# Patient Record
Sex: Male | Born: 1960 | Race: White | Hispanic: No
Health system: Southern US, Community
[De-identification: ages and names within clinical notes are randomized; demographics above are authoritative.]

---

## 2018-05-11 ENCOUNTER — Emergency Department (HOSPITAL_BASED_OUTPATIENT_CLINIC_OR_DEPARTMENT_OTHER): Payer: Self-pay

## 2018-05-11 ENCOUNTER — Emergency Department (HOSPITAL_BASED_OUTPATIENT_CLINIC_OR_DEPARTMENT_OTHER)
Admission: EM | Admit: 2018-05-11 | Discharge: 2018-05-11 | Disposition: A | Discharge status: Home / Self Care | Payer: Self-pay | Attending: Emergency Medicine | Admitting: Emergency Medicine

## 2018-05-11 ENCOUNTER — Encounter (HOSPITAL_BASED_OUTPATIENT_CLINIC_OR_DEPARTMENT_OTHER): Payer: Self-pay | Admitting: Emergency Medicine

## 2018-05-11 ENCOUNTER — Other Ambulatory Visit: Payer: Self-pay

## 2018-05-11 DIAGNOSIS — Z23 Encounter for immunization: Secondary | ICD-10-CM | POA: Insufficient documentation

## 2018-05-11 DIAGNOSIS — Y9389 Activity, other specified: Secondary | ICD-10-CM | POA: Insufficient documentation

## 2018-05-11 DIAGNOSIS — W25XXXA Contact with sharp glass, initial encounter: Secondary | ICD-10-CM | POA: Insufficient documentation

## 2018-05-11 DIAGNOSIS — Y929 Unspecified place or not applicable: Secondary | ICD-10-CM | POA: Insufficient documentation

## 2018-05-11 DIAGNOSIS — S61411A Laceration without foreign body of right hand, initial encounter: Secondary | ICD-10-CM | POA: Insufficient documentation

## 2018-05-11 DIAGNOSIS — Y999 Unspecified external cause status: Secondary | ICD-10-CM | POA: Insufficient documentation

## 2018-05-11 MED ORDER — CEPHALEXIN 500 MG PO CAPS: 500.0000 mg | ORAL_CAPSULE | Freq: Four times a day (QID) | ORAL | 0 refills | Status: AC

## 2018-05-11 MED ORDER — TETANUS-DIPHTH-ACELL PERTUSSIS 5-2.5-18.5 LF-MCG/0.5 IM SUSP
0.5000 mL | Freq: Once | INTRAMUSCULAR | Status: AC
  Administered 2018-05-11: 0.5 mL via INTRAMUSCULAR
  Filled 2018-05-11: qty 0.5

## 2018-05-11 MED ORDER — LIDOCAINE-EPINEPHRINE (PF) 2 %-1:200000 IJ SOLN
10.0000 mL | Freq: Once | INTRAMUSCULAR | Status: AC
  Administered 2018-05-11: 10 mL
  Filled 2018-05-11 (×2): qty 10

## 2018-05-11 MED ORDER — LIDOCAINE-EPINEPHRINE (PF) 2 %-1:200000 IJ SOLN
INTRAMUSCULAR | Status: AC
  Administered 2018-05-11: 10 mL
  Filled 2018-05-11: qty 10

## 2018-05-11 NOTE — ED Provider Notes (Signed)
[facility] EMERGENCY DEPARTMENT Provider Note   CSN: [id number] Arrival date & time: 05/11/18  1225     History   Chief Complaint Chief Complaint  Patient presents with  . Laceration    HPI Alec Mitchell is a 57 y.o. male who presents to ED for dominant right palm laceration that occurred prior to arrival.  He was trying to change some glass tiles when he lacerated his hand with a piece of the glass.  States that he began having bleeding which improved with direct pressure.  Denies any anticoagulant use.  Unsure of last tetanus.  Denies any other injuries.  HPI  History reviewed. No pertinent past medical history.  There are no active problems to display for this patient.         Home Medications    Prior to Admission medications   Medication Sig Start Date End Date Taking? Authorizing Provider  cephALEXin (KEFLEX) 500 MG capsule Take 1 capsule (500 mg total) by mouth 4 (four) times daily. 05/11/18   Dietrich PatesKhatri, Reiley Keisler, PA-C    Family History History reviewed. No pertinent family history.  Social History Social History   Tobacco Use  . Smoking status: Not on file  Substance Use Topics  . Alcohol use: Not on file  . Drug use: Not on file     Allergies   Aspirin   Review of Systems Review of Systems  Constitutional: Negative for chills and fever.  Skin: Positive for wound.  Neurological: Negative for weakness and numbness.     Physical Exam Updated Vital Signs BP (!) 170/98   Pulse 66   Temp 97.7 F (36.5 C) (Oral)   Resp 16   Ht 5\' 10"  (1.778 m)   Wt 115.2 kg   SpO2 97%   BMI 36.45 kg/m   Physical Exam Vitals signs and nursing note reviewed.  Constitutional:      General: He is not in acute distress.    Appearance: He is well-developed. He is not diaphoretic.  HENT:     Head: Normocephalic and atraumatic.  Eyes:     General: No scleral icterus.    Conjunctiva/sclera: Conjunctivae normal.  Neck:     Musculoskeletal: Normal  range of motion.  Pulmonary:     Effort: Pulmonary effort is normal. No respiratory distress.  Skin:    Findings: Laceration present. No rash.     Comments: 2 cm laceration noted on the right medial palm.  No active bleeding noted. Some subcutaneous tissue noted.  Neurological:     Mental Status: He is alert.      ED Treatments / Results  Labs (all labs ordered are listed, but only abnormal results are displayed) Labs Reviewed - No data to display  EKG None  Radiology Dg Hand Complete Right  Result Date: 05/11/2018 CLINICAL DATA:  Laceration over the fifth metacarpal palmar aspect. EXAM: RIGHT HAND - COMPLETE 3+ VIEW COMPARISON:  None. FINDINGS: Normal alignment. No acute osseous finding or significant joint abnormality. No subluxation or dislocation. No radiopaque foreign body. IMPRESSION: No acute or significant finding by plain radiography. Electronically Signed   By: Judie PetitM.  Shick M.D.   On: 05/11/2018 13:15    Procedures .Marland Kitchen.Laceration Repair Date/Time: 05/11/2018 3:14 PM Performed by: Dietrich PatesKhatri, Advay Volante, PA-C Authorized by: Dietrich PatesKhatri, Bailea Beed, PA-C   Consent:    Consent obtained:  Verbal   Consent given by:  Patient   Risks discussed:  Infection, need for additional repair, nerve damage, pain, poor cosmetic result, poor wound  healing, retained foreign body, tendon damage and vascular damage Anesthesia (see MAR for exact dosages):    Anesthesia method:  Local infiltration   Local anesthetic:  Lidocaine 2% WITH epi Laceration details:    Location:  Hand   Hand location:  R palm   Length (cm):  2 Repair type:    Repair type:  Simple Exploration:    Hemostasis achieved with:  Direct pressure   Wound exploration: wound explored through full range of motion     Contaminated: yes   Treatment:    Area cleansed with:  Saline   Amount of cleaning:  Extensive   Irrigation solution:  Sterile saline   Irrigation method:  Pressure wash Skin repair:    Repair method:  Sutures   Suture  size:  4-0   Suture material:  Nylon   Suture technique:  Simple interrupted   Number of sutures:  5 Approximation:    Approximation:  Close Post-procedure details:    Dressing:  Antibiotic ointment   Patient tolerance of procedure:  Tolerated well, no immediate complications   (including critical care time)  Medications Ordered in ED Medications  lidocaine-EPINEPHrine (XYLOCAINE W/EPI) 2 %-1:200000 (PF) injection 10 mL (has no administration in time range)  Tdap (BOOSTRIX) injection 0.5 mL (0.5 mLs Intramuscular Given 05/11/18 1354)     Initial Impression / Assessment and Plan / ED Course  I have reviewed the triage vital signs and the nursing notes.  Pertinent labs & imaging results that were available during my care of the patient were reviewed by me and considered in my medical decision making (see chart for details).     Patient counseled on wound care. Patient counseled on need to return or see PCP/urgent care for suture removal in 10-12 days.  X-rays negative but due to high likelihood of contamination and location of wound will give prophylactic antibiotic.  Patient was urged to return to the Emergency Department urgently with worsening pain, swelling, expanding erythema especially if it streaks away from the affected area, fever, or if they have any other concerns. Patient verbalized understanding.   Patient is hemodynamically stable, in NAD, and able to ambulate in the ED. Evaluation does not show pathology that would require ongoing emergent intervention or inpatient treatment. I explained the diagnosis to the patient. Pain has been managed and has no complaints prior to discharge. Patient is comfortable with above plan and is stable for discharge at this time. All questions were answered prior to disposition. Strict return precautions for returning to the ED were discussed. Encouraged follow up with PCP.    Portions of this note were generated with [employer] dictation  software. Dictation errors may occur despite best attempts at proofreading.   Final Clinical Impressions(s) / ED Diagnoses   Final diagnoses:  Laceration of right hand, foreign body presence unspecified, initial encounter    ED Discharge Orders         Ordered    cephALEXin (KEFLEX) 500 MG capsule  4 times daily     05/11/18 1514           Dietrich PatesKhatri, Madelyn Tlatelpa, PA-C 05/11/18 1515    Arby BarrettePfeiffer, Marcy, MD 05/13/18 [phone]

## 2018-05-11 NOTE — ED Triage Notes (Signed)
Laceration to right hand while at work today.  Reports this occurred from glass.  Tetanus status unknown.

## 2018-05-11 NOTE — Discharge Instructions (Signed)
Return to ED for signs of infection including redness, swelling, pus draining from the area or fever.

## 2018-05-11 NOTE — ED Notes (Signed)
EMT irrigated wound with saline and iodine. Wound is clean and patient has gauze placed over wound due to bleeding.

## 2019-12-24 IMAGING — CR DG HAND COMPLETE 3+V*R*
3 series · 3 of 3 positions shown · non-contrast
Comparison: None.

CLINICAL DATA: Laceration over the fifth metacarpal palmar aspect.

EXAM:
RIGHT HAND - COMPLETE 3+ VIEW

[x hand pa right]
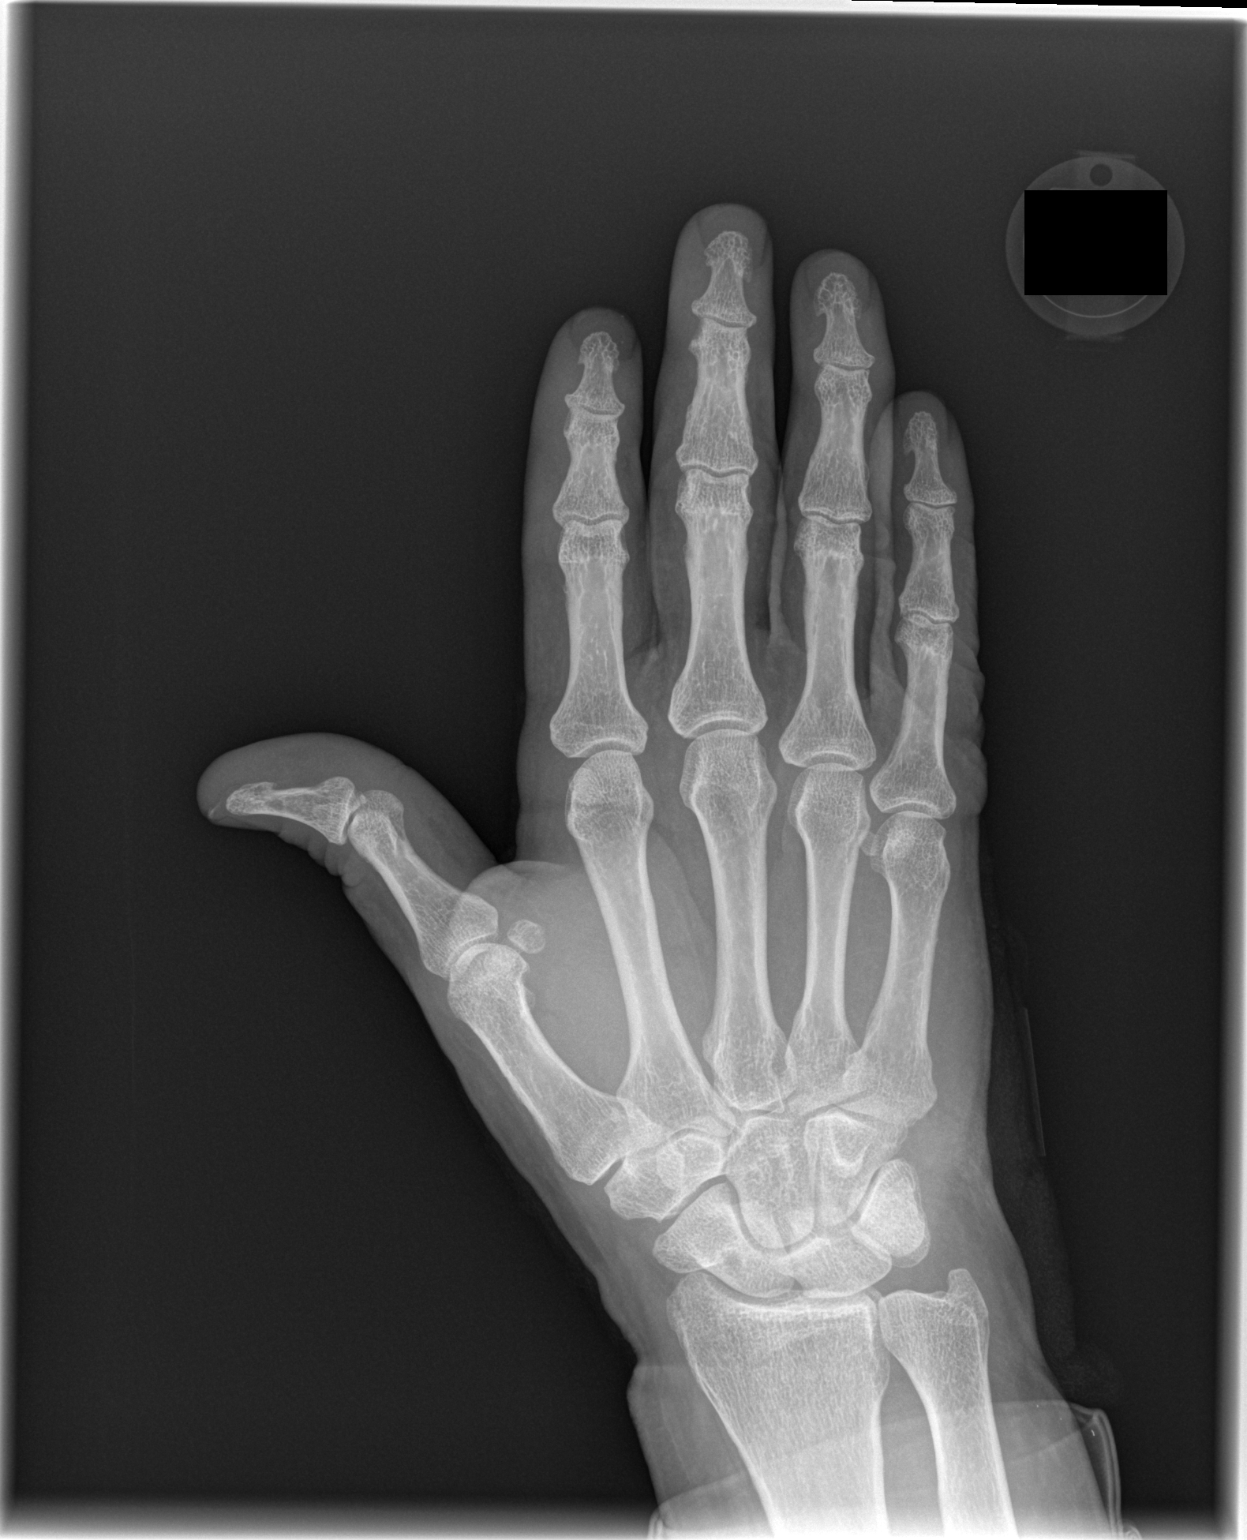

[x hand oblique right]
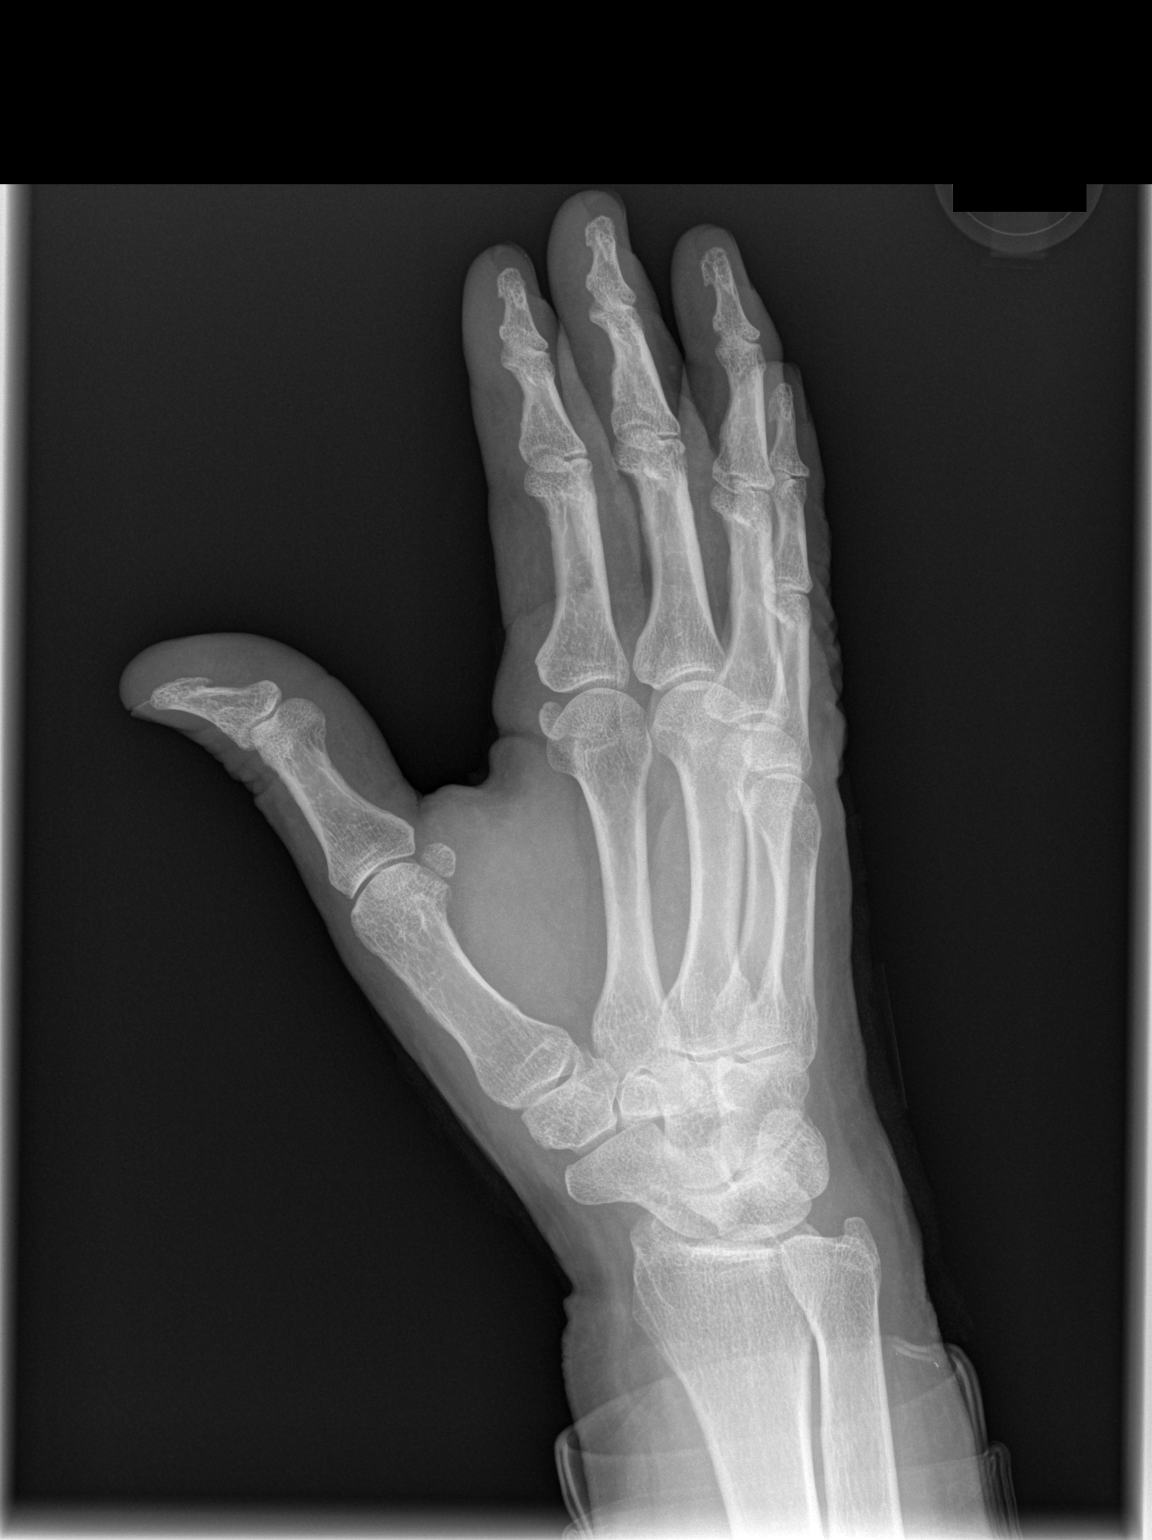

[x hand lat right]
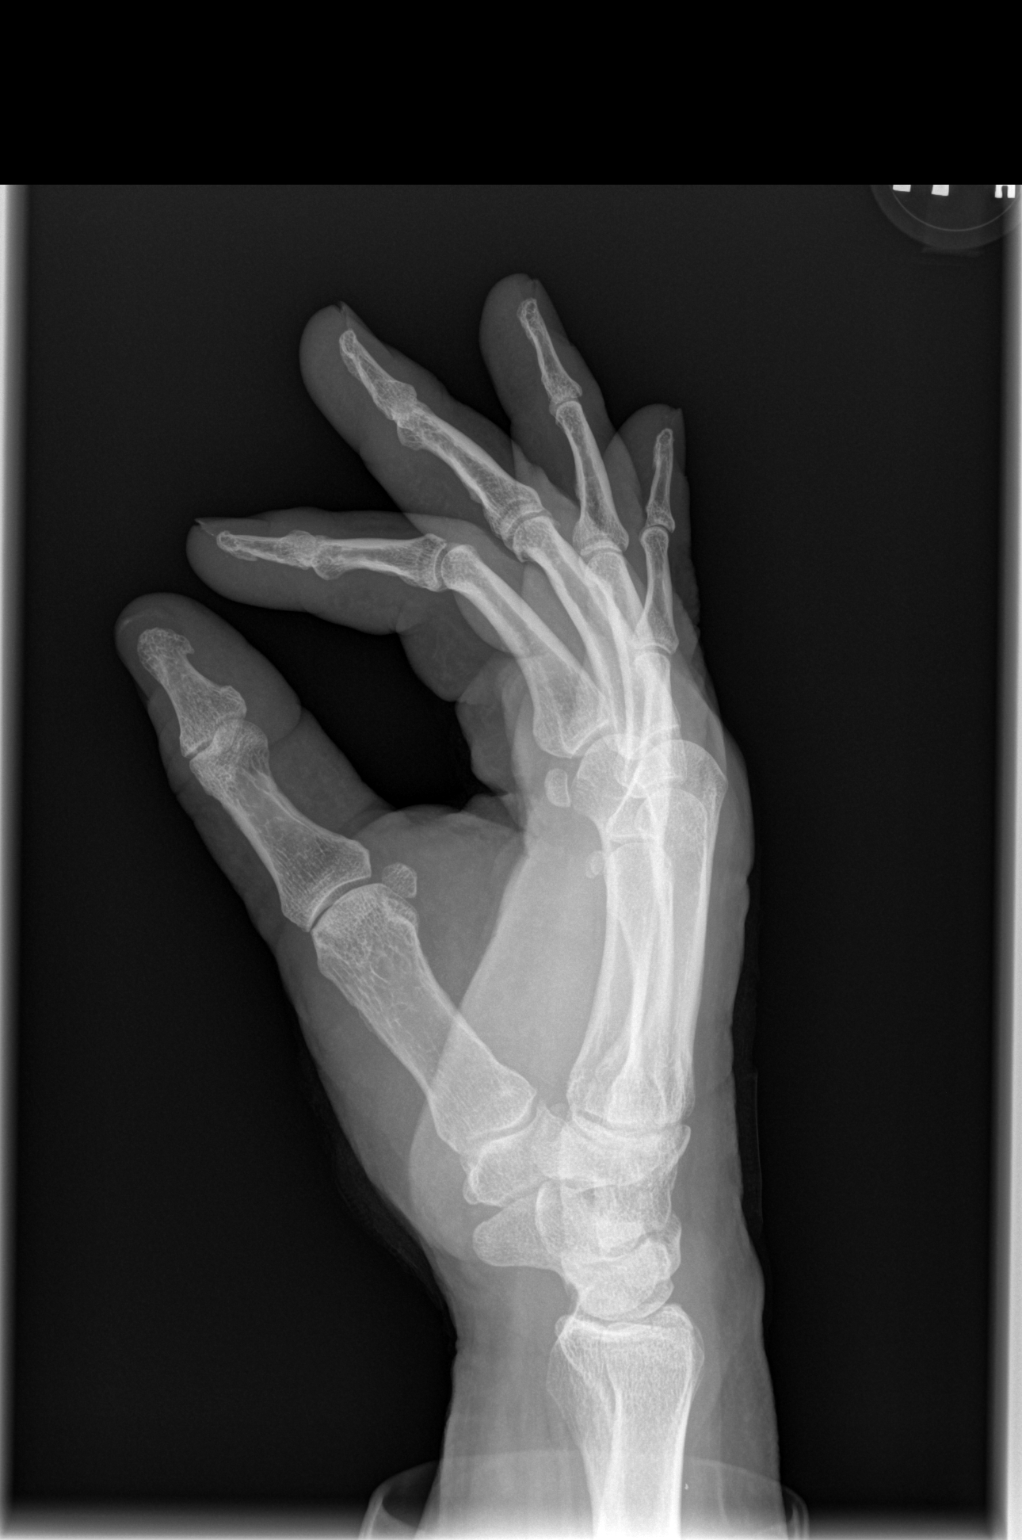

[3 of 3 positions shown; findings below may reference images not displayed]

FINDINGS: Normal alignment. No acute osseous finding or significant joint
abnormality. No subluxation or dislocation. No radiopaque foreign
body.
IMPRESSION: No acute or significant finding by plain radiography.

## 2020-03-31 ENCOUNTER — Other Ambulatory Visit (HOSPITAL_COMMUNITY): Payer: Self-pay | Admitting: Urgent Care

## 2020-03-31 DIAGNOSIS — M7989 Other specified soft tissue disorders: Secondary | ICD-10-CM

## 2020-03-31 DIAGNOSIS — M79605 Pain in left leg: Secondary | ICD-10-CM

## 2020-04-01 ENCOUNTER — Ambulatory Visit (HOSPITAL_COMMUNITY)
Admission: RE | Admit: 2020-04-01 | Discharge: 2020-04-01 | Disposition: A | Discharge status: Home / Self Care | Payer: Self-pay | Source: Ambulatory Visit | Attending: Family Medicine | Admitting: Family Medicine

## 2020-04-01 ENCOUNTER — Other Ambulatory Visit: Payer: Self-pay

## 2020-04-01 DIAGNOSIS — M7989 Other specified soft tissue disorders: Secondary | ICD-10-CM | POA: Insufficient documentation

## 2020-04-01 DIAGNOSIS — M79605 Pain in left leg: Secondary | ICD-10-CM | POA: Insufficient documentation
